# Patient Record
Sex: Male | Born: 1975 | Race: White | Hispanic: No | Smoking: Never smoker
Health system: Southern US, Community
[De-identification: ages and names within clinical notes are randomized; demographics above are authoritative.]

## PROBLEM LIST (undated history)

## (undated) DIAGNOSIS — I10 Essential (primary) hypertension: Secondary | ICD-10-CM

## (undated) HISTORY — PX: TONSILLECTOMY: SUR1361

## (undated) HISTORY — DX: Essential (primary) hypertension: I10

---

## 2019-02-23 ENCOUNTER — Emergency Department (HOSPITAL_COMMUNITY)
Admission: EM | Admit: 2019-02-23 | Discharge: 2019-02-23 | Disposition: A | Discharge status: Home / Self Care | Payer: BC Managed Care – PPO | Attending: Emergency Medicine | Admitting: Emergency Medicine

## 2019-02-23 ENCOUNTER — Emergency Department (HOSPITAL_COMMUNITY): Payer: BC Managed Care – PPO

## 2019-02-23 DIAGNOSIS — I10 Essential (primary) hypertension: Secondary | ICD-10-CM | POA: Insufficient documentation

## 2019-02-23 DIAGNOSIS — R519 Headache, unspecified: Secondary | ICD-10-CM

## 2019-02-23 DIAGNOSIS — R51 Headache: Secondary | ICD-10-CM | POA: Diagnosis not present

## 2019-02-23 DIAGNOSIS — R202 Paresthesia of skin: Secondary | ICD-10-CM | POA: Insufficient documentation

## 2019-02-23 NOTE — Discharge Instructions (Signed)
Please return for any problem.  Follow-up with your regular care provider as instructed. °

## 2019-02-23 NOTE — ED Notes (Signed)
Patient transported to CT 

## 2019-02-23 NOTE — ED Provider Notes (Signed)
[facility] Provider Note   CSN: [id number] Arrival date & time: 02/23/19  1306     History   Chief Complaint Chief Complaint  Patient presents with  . Hypertension    HPI Nicki ReaperDenis Inglis is a 43 y.o. male.     43 year old male with prior medical history as detailed below presents for evaluation of reported headache.  Patient reports onset of headache yesterday morning.  This headache persisted throughout the entire day.  He currently is now headache free.  He did experience some mild tingling of the left cheek during his headache.  The tingling occurred earlier this morning.  He denies other associated symptoms such as fever, nausea, vomiting, other focal weakness or paresthesia.  Patient denies prior history of migraines.  Patient reports that he has some situational hypertension related to anxiety.  He is not currently taking antihypertensives.  The history is provided by the patient and medical records.  Headache Pain location:  Occipital Quality:  Dull Radiates to:  Does not radiate Onset quality:  Gradual Duration:  1 day Timing:  Constant Progression:  Resolved Chronicity:  New Similar to prior headaches: no   Relieved by:  Nothing Worsened by:  Nothing   No past medical history on file.  There are no active problems to display for this patient.        Home Medications    Prior to Admission medications   Not on File    Family History No family history on file.  Social History Social History   Tobacco Use  . Smoking status: Not on file  Substance Use Topics  . Alcohol use: Not on file  . Drug use: Not on file     Allergies   Patient has no known allergies.   Review of Systems Review of Systems  Neurological: Positive for headaches.  All other systems reviewed and are negative.    Physical Exam Updated Vital Signs BP (!) 144/96 (BP Location: Right Arm)   Pulse 74   Temp 98.7 F (37.1 C) (Oral)    Resp 16   SpO2 100%   Physical Exam Vitals signs and nursing note reviewed.  Constitutional:      General: He is not in acute distress.    Appearance: He is well-developed.  HENT:     Head: Normocephalic and atraumatic.  Eyes:     Conjunctiva/sclera: Conjunctivae normal.     Pupils: Pupils are equal, round, and reactive to light.  Neck:     Musculoskeletal: Normal range of motion and neck supple.  Cardiovascular:     Rate and Rhythm: Normal rate and regular rhythm.     Heart sounds: Normal heart sounds.  Pulmonary:     Effort: Pulmonary effort is normal. No respiratory distress.     Breath sounds: Normal breath sounds.  Abdominal:     General: There is no distension.     Palpations: Abdomen is soft.     Tenderness: There is no abdominal tenderness.  Musculoskeletal: Normal range of motion.        General: No deformity.  Skin:    General: Skin is warm and dry.  Neurological:     General: No focal deficit present.     Mental Status: He is alert and oriented to person, place, and time. Mental status is at baseline.     Cranial Nerves: No cranial nerve deficit.     Sensory: No sensory deficit.     Motor: No weakness.  Coordination: Coordination normal.     Gait: Gait normal.      ED Treatments / Results  Labs (all labs ordered are listed, but only abnormal results are displayed) Labs Reviewed - No data to display  EKG EKG Interpretation  Date/Time:  Friday February 23 2019 13:14:08 EDT Ventricular Rate:  74 PR Interval:  130 QRS Duration: 104 QT Interval:  408 QTC Calculation: 452 R Axis:   68 Text Interpretation:  Sinus rhythm with sinus arrhythmia with occasional Premature ventricular complexes Incomplete right bundle branch block Nonspecific ST abnormality Abnormal ECG Confirmed by Dene Gentry [phone]) on 02/23/2019 5:22:31 PM   Radiology Ct Head Wo Contrast  Result Date: 02/23/2019 CLINICAL DATA:  Severe headache EXAM: CT HEAD WITHOUT CONTRAST  TECHNIQUE: Contiguous axial images were obtained from the base of the skull through the vertex without intravenous contrast. COMPARISON:  None. FINDINGS: Brain: No evidence of acute infarction, hemorrhage, hydrocephalus, extra-axial collection or mass lesion/mass effect. Vascular: No hyperdense vessel or unexpected calcification. Skull: Normal. Negative for fracture or focal lesion. Sinuses/Orbits: Mucous retention cysts and mucosal thickening left maxillary sinus. Other: None IMPRESSION: Negative non contrasted CT appearance of the brain Electronically Signed   By: Donavan Foil M.D.   On: 02/23/2019 19:30    Procedures Procedures (including critical care time)  Medications Ordered in ED Medications - No data to display   Initial Impression / Assessment and Plan / ED Course  I have reviewed the triage vital signs and the nursing notes.  Pertinent labs & imaging results that were available during my care of the patient were reviewed by me and considered in my medical decision making (see chart for details).        MDM  Screen complete  Odell Choung was evaluated in Emergency Department on 02/23/2019 for the symptoms described in the history of present illness. He was evaluated in the context of the global COVID-19 pandemic, which necessitated consideration that the patient might be at risk for infection with the SARS-CoV-2 virus that causes COVID-19. Institutional protocols and algorithms that pertain to the evaluation of patients at risk for COVID-19 are in a state of rapid change based on information released by regulatory bodies including the CDC and federal and state organizations. These policies and algorithms were followed during the patient's care in the ED.   Patient is presenting for evaluation of reported headache.  His symptoms have resolved completely prior to arrival.  His neuro exam is normal.  He does not have any findings consistent with or concerning for significant acute  pathology.  Patient's blood pressure is noted to be mildly elevated.  He understands the need for close follow-up regarding this.  CT imaging of his brain does not show anything acute.  Patient does feel improved following his ED evaluation.  He now desires discharge home.  Importance of close follow-up was stressed.   Final Clinical Impressions(s) / ED Diagnoses   Final diagnoses:  Hypertension, unspecified type  Acute nonintractable headache, unspecified headache type    ED Discharge Orders    None       Valarie Merino, MD 02/23/19 [phone]

## 2019-02-23 NOTE — ED Triage Notes (Signed)
States has experienced headaches for the past 2 days but denies at present time. Denies blurred vision, weakness, numbness, or tingling in extremities.

## 2019-02-23 NOTE — ED Notes (Signed)
Patient verbalizes understanding of discharge instructions. Opportunity for questioning and answers were provided. Armband removed by staff, pt discharged from ED ambulatory to home.  

## 2019-02-23 NOTE — ED Triage Notes (Signed)
Pt to ER sent from [facility] for evaluation of hypertension. No previous diagnosis of same, not on any medications. Reports during his divorce a few years ago he had this problem and lost some weight and his BP decreased. He is in NAD. Apparently this morning he had some facial numbness which has since resolved. No focal neuro deficits apparent.

## 2019-05-17 ENCOUNTER — Other Ambulatory Visit: Payer: Self-pay

## 2019-05-17 DIAGNOSIS — Z20822 Contact with and (suspected) exposure to covid-19: Secondary | ICD-10-CM

## 2019-05-19 LAB — NOVEL CORONAVIRUS, NAA: SARS-CoV-2, NAA: DETECTED — AB

## 2019-07-22 ENCOUNTER — Ambulatory Visit: Payer: BC Managed Care – PPO

## 2020-09-07 IMAGING — CT CT HEAD W/O CM
4 series · 17 of 47 positions shown, 19 images · non-contrast
Comparison: None.

CLINICAL DATA: Severe headache

EXAM:
CT HEAD WITHOUT CONTRAST
TECHNIQUE: Contiguous axial images were obtained from the base of the skull
through the vertex without intravenous contrast.

[Series 3: head without · axial · non-contrast · 0.46mm/px · z∈[-66,+64]mm · 7 of 36 slices shown, 9 images]
[im 5/36  brain]
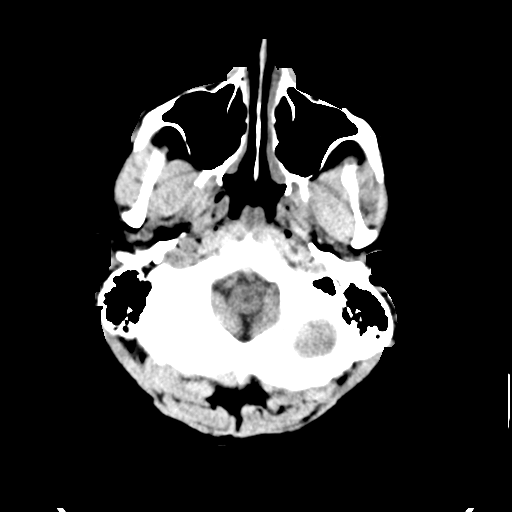
[im 5/36  bone]
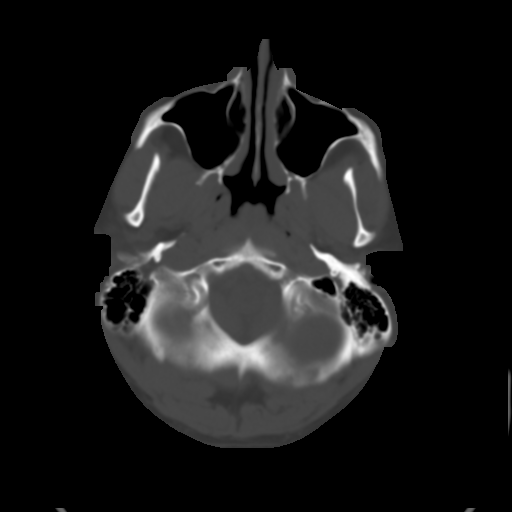
[im 9/36  brain]
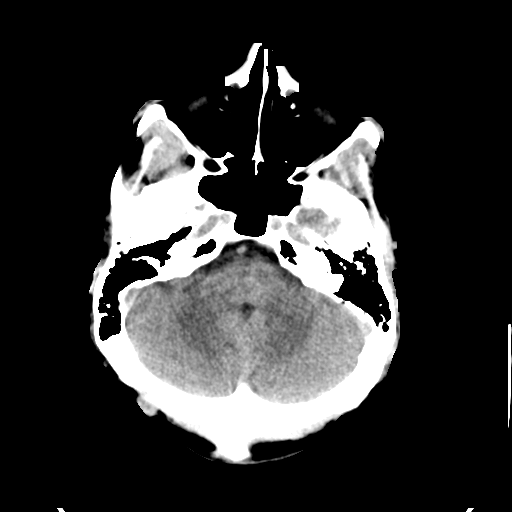
[im 14/36  brain]
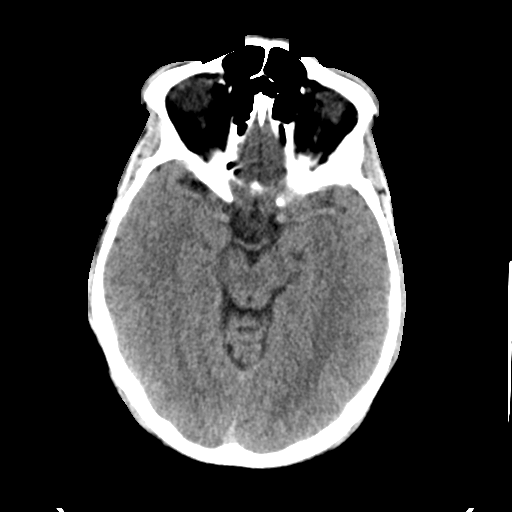
[im 18/36  brain]
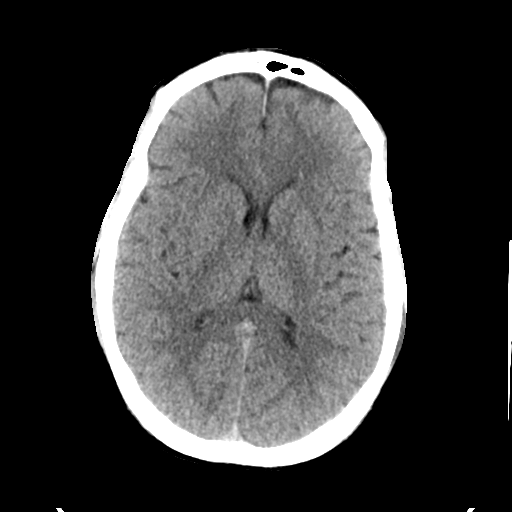
[im 22/36  brain]
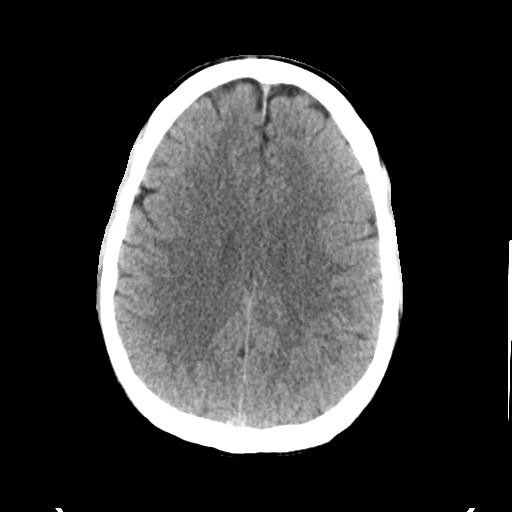
[im 22/36  bone]
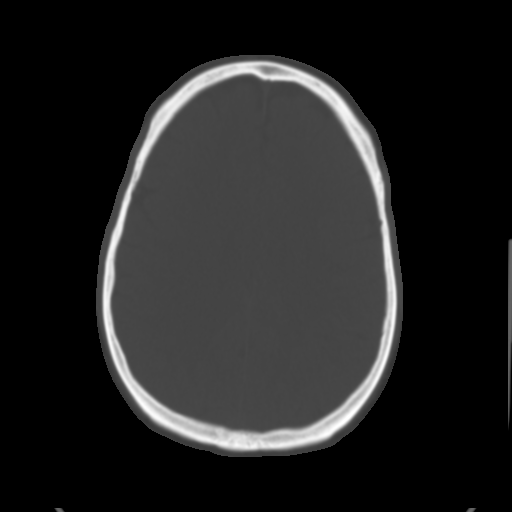
[im 27/36  brain]
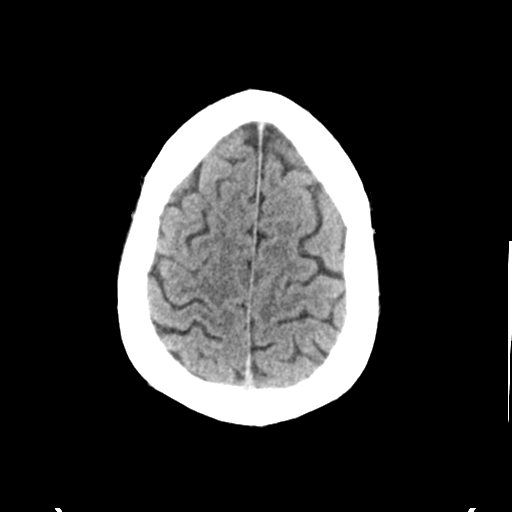
[im 31/36  brain]
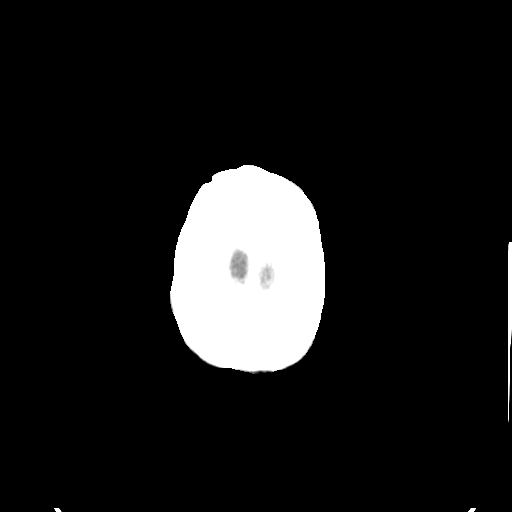

[Series 4: head bone · axial · 0.46mm/px · z∈[-70,-6]mm · 4 of 90 slices shown]
[im 9/90  bone]
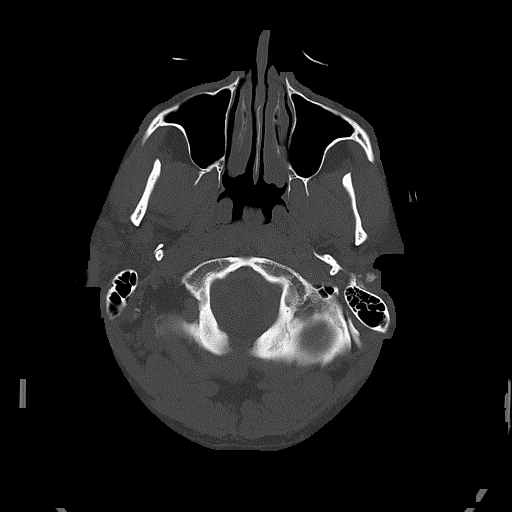
[im 18/90  bone]
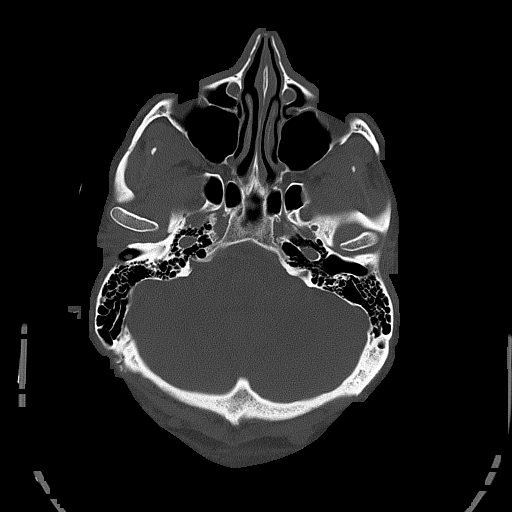
[im 27/90  bone]
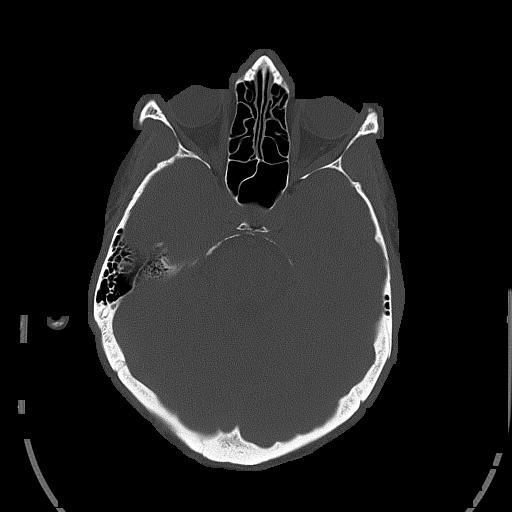
[im 41/90  bone]
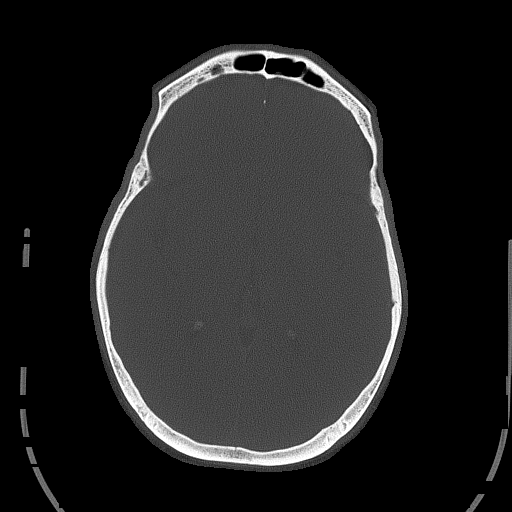

[Series 5: head without cor · coronal · non-contrast · 0.35mm/px · 3 of 76 slices shown]
[im 26/76  brain]
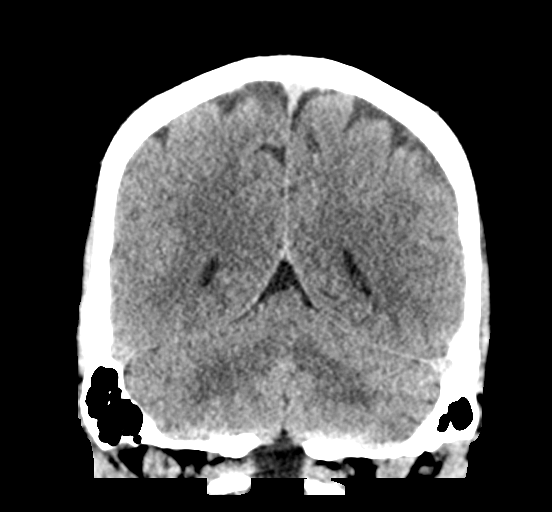
[im 34/76  brain]
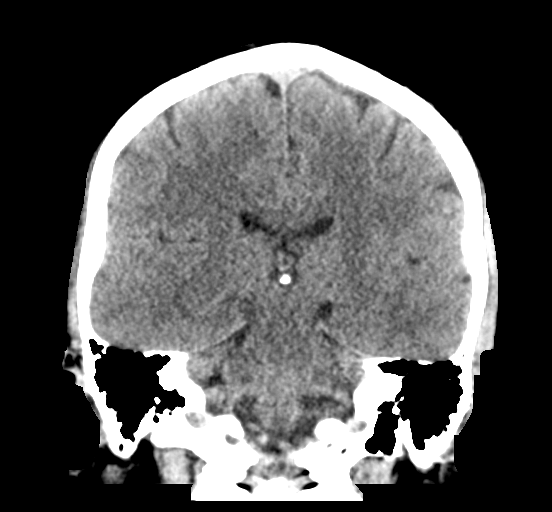
[im 42/76  brain]
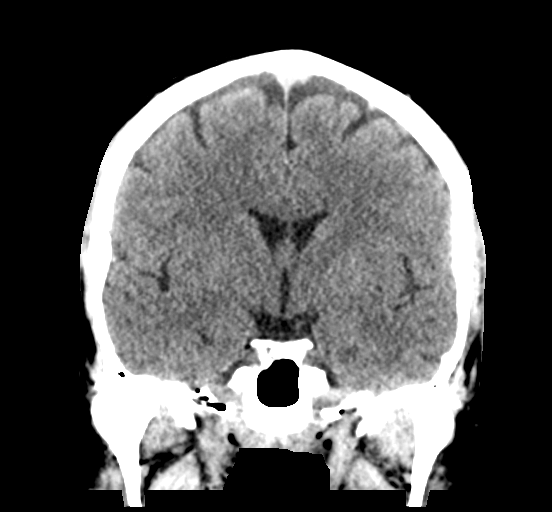

[Series 6: head without sag · sagittal · non-contrast · 0.35mm/px · 3 of 65 slices shown]
[im 22/65  brain]
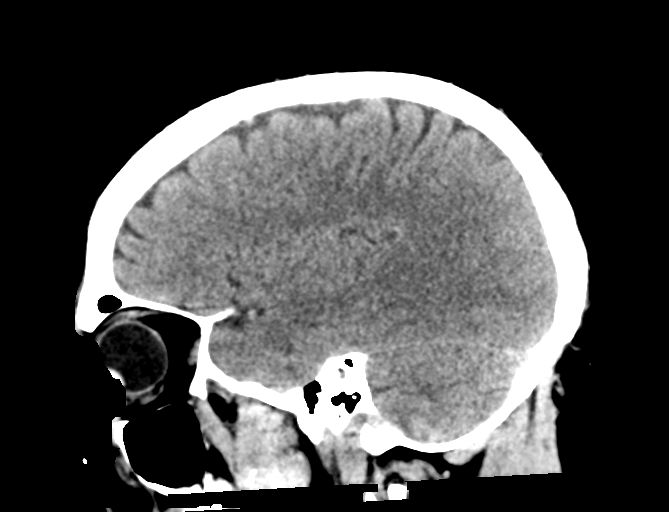
[im 33/65  brain]
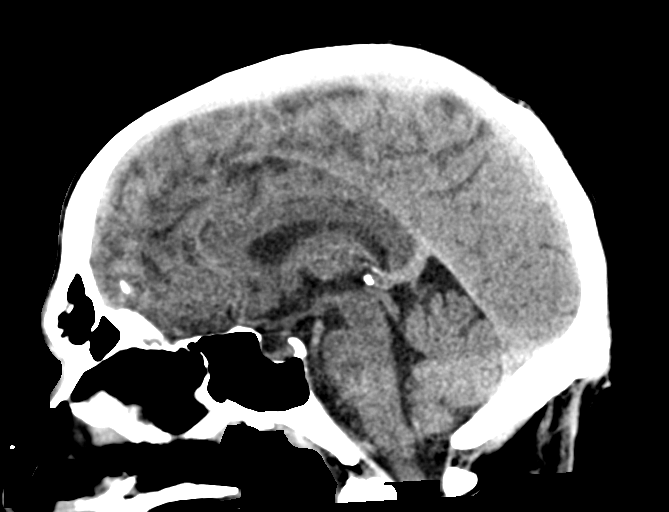
[im 43/65  brain]
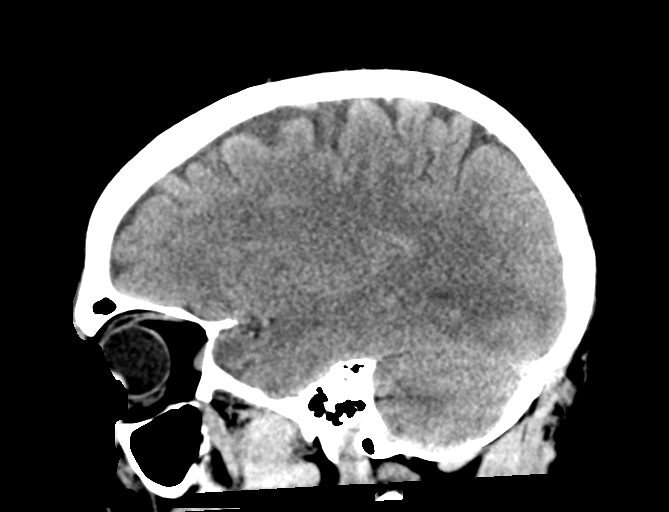

[17 of 47 positions shown; findings below may reference images not displayed]

FINDINGS: Brain: No evidence of acute infarction, hemorrhage, hydrocephalus,
extra-axial collection or mass lesion/mass effect.

Vascular: No hyperdense vessel or unexpected calcification.

Skull: Normal. Negative for fracture or focal lesion.

Sinuses/Orbits: Mucous retention cysts and mucosal thickening left
maxillary sinus.

Other: None
IMPRESSION: Negative non contrasted CT appearance of the brain

## 2023-07-26 ENCOUNTER — Other Ambulatory Visit: Payer: Self-pay

## 2023-07-26 ENCOUNTER — Emergency Department (HOSPITAL_BASED_OUTPATIENT_CLINIC_OR_DEPARTMENT_OTHER): Payer: 59

## 2023-07-26 ENCOUNTER — Encounter (HOSPITAL_BASED_OUTPATIENT_CLINIC_OR_DEPARTMENT_OTHER): Payer: Self-pay | Admitting: *Deleted

## 2023-07-26 ENCOUNTER — Emergency Department (HOSPITAL_BASED_OUTPATIENT_CLINIC_OR_DEPARTMENT_OTHER)
Admission: EM | Admit: 2023-07-26 | Discharge: 2023-07-26 | Disposition: A | Discharge status: Home / Self Care | Payer: 59 | Attending: Emergency Medicine | Admitting: Emergency Medicine

## 2023-07-26 DIAGNOSIS — I10 Essential (primary) hypertension: Secondary | ICD-10-CM | POA: Diagnosis not present

## 2023-07-26 DIAGNOSIS — Z79899 Other long term (current) drug therapy: Secondary | ICD-10-CM | POA: Insufficient documentation

## 2023-07-26 DIAGNOSIS — R051 Acute cough: Secondary | ICD-10-CM | POA: Insufficient documentation

## 2023-07-26 DIAGNOSIS — R000 Tachycardia, unspecified: Secondary | ICD-10-CM | POA: Diagnosis not present

## 2023-07-26 DIAGNOSIS — R778 Other specified abnormalities of plasma proteins: Secondary | ICD-10-CM | POA: Insufficient documentation

## 2023-07-26 DIAGNOSIS — R072 Precordial pain: Secondary | ICD-10-CM | POA: Diagnosis present

## 2023-07-26 DIAGNOSIS — R7989 Other specified abnormal findings of blood chemistry: Secondary | ICD-10-CM

## 2023-07-26 LAB — HEPATIC FUNCTION PANEL
ALT: 38 U/L (ref 0–44)
AST: 21 U/L (ref 15–41)
Albumin: 4.1 g/dL (ref 3.5–5.0)
Alkaline Phosphatase: 39 U/L (ref 38–126)
Bilirubin, Direct: 0.1 mg/dL (ref 0.0–0.2)
Indirect Bilirubin: 0.4 mg/dL (ref 0.3–0.9)
Total Bilirubin: 0.5 mg/dL (ref 0.0–1.2)
Total Protein: 7 g/dL (ref 6.5–8.1)

## 2023-07-26 LAB — BASIC METABOLIC PANEL
Anion gap: 10 (ref 5–15)
BUN: 16 mg/dL (ref 6–20)
CO2: 21 mmol/L — ABNORMAL LOW (ref 22–32)
Calcium: 8.7 mg/dL — ABNORMAL LOW (ref 8.9–10.3)
Chloride: 105 mmol/L (ref 98–111)
Creatinine, Ser: 0.95 mg/dL (ref 0.61–1.24)
GFR, Estimated: >60 mL/min (ref >60)
Glucose, Bld: 114 mg/dL — ABNORMAL HIGH (ref 70–99)
Potassium: 3.4 mmol/L — ABNORMAL LOW (ref 3.5–5.1)
Sodium: 136 mmol/L (ref 135–145)

## 2023-07-26 LAB — RESP PANEL BY RT-PCR (RSV, FLU A&B, COVID)  RVPGX2
Influenza A by PCR: NEGATIVE
Influenza B by PCR: NEGATIVE
Resp Syncytial Virus by PCR: NEGATIVE
SARS Coronavirus 2 by RT PCR: NEGATIVE

## 2023-07-26 LAB — CBC
HCT: 41 % (ref 39.0–52.0)
Hemoglobin: 14 g/dL (ref 13.0–17.0)
MCH: 31 pg (ref 26.0–34.0)
MCHC: 34.1 g/dL (ref 30.0–36.0)
MCV: 90.9 fL (ref 80.0–100.0)
Platelets: 229 10*3/uL (ref 150–400)
RBC: 4.51 MIL/uL (ref 4.22–5.81)
RDW: 12.5 % (ref 11.5–15.5)
WBC: 6.3 10*3/uL (ref 4.0–10.5)
nRBC: 0 % (ref 0.0–0.2)

## 2023-07-26 LAB — TSH: TSH: 0.84 u[IU]/mL (ref 0.350–4.500)

## 2023-07-26 LAB — D-DIMER, QUANTITATIVE: D-Dimer, Quant: 0.28 ug{FEU}/mL (ref 0.00–0.50)

## 2023-07-26 LAB — BRAIN NATRIURETIC PEPTIDE: B Natriuretic Peptide: 46.7 pg/mL (ref 0.0–100.0)

## 2023-07-26 LAB — TROPONIN I (HIGH SENSITIVITY)
Troponin I (High Sensitivity): 24 ng/L — ABNORMAL HIGH (ref <18)
Troponin I (High Sensitivity): 26 ng/L — ABNORMAL HIGH (ref <18)

## 2023-07-26 MED ORDER — AMLODIPINE BESYLATE 5 MG PO TABS: 5.0000 mg | ORAL_TABLET | Freq: Every day | ORAL | 0 refills | Status: DC

## 2023-07-26 NOTE — ED Notes (Signed)
 Pt ambulatory to bathroom, no assistance needed.

## 2023-07-26 NOTE — ED Triage Notes (Signed)
Pt reports that his BP is elevated and he has a feeling like his heart is skipping beats and he has some pressure on his chest.  Pt reports that these symptoms have been going on for 2 weeks.  He states that he began having a cough about 2 weeks ago and he states that he has had a lot stress.

## 2023-07-26 NOTE — ED Notes (Signed)
 D/c paperwork reviewed with pt, including prescriptions and follow up care.  All questions and/or concerns addressed at time of d/c.  No further needs expressed. . Pt verbalized understanding, Ambulatory with significant other to ED exit, NAD.

## 2023-07-26 NOTE — Discharge Instructions (Signed)
Please read and follow all provided instructions.  Your diagnoses today include:  1. Precordial pain   2. Acute cough   3. Elevated troponin   4. Uncontrolled hypertension     Tests performed today include: An EKG of your heart: was abnormal but not changed during ED stay A chest x-ray: was clear Cardiac enzymes - a blood test for heart muscle damage, mildly elevated today but not rising significantly Blood counts and electrolytes D-dimer: Screening test for blood clot was negative BNP: Screening test for heart failure was negative Vital signs. See below for your results today.   Medications prescribed:  Amlodipine: Medication for blood pressure  Take any prescribed medications only as directed.  Follow-up instructions: Please follow-up with your primary care provider as soon as you can for further evaluation of your symptoms.  I have also placed a cardiology referral on your behalf.  Please follow-up with them in the next week for recheck of your symptoms and to determine if you need further evaluation.  Return instructions:  SEEK IMMEDIATE MEDICAL ATTENTION IF: You have severe chest pain, especially if the pain is crushing or pressure-like and spreads to the arms, back, neck, or jaw, or if you have sweating, nausea or vomiting, or trouble with breathing. THIS IS AN EMERGENCY. Do not wait to see if the pain will go away. Get medical help at once. Call 911. DO NOT drive yourself to the hospital.  Your chest pain gets worse and does not go away after a few minutes of rest.  You have an attack of chest pain lasting longer than what you usually experience.  You have significant dizziness, if you pass out, or have trouble walking.  You have chest pain not typical of your usual pain for which you originally saw your caregiver.  You have any other emergent concerns regarding your health.  Additional Information: Chest pain comes from many different causes. Your caregiver has diagnosed  you as having chest pain that is not specific for one problem, but does not require admission.  You are at low risk for an acute heart condition or other serious illness.   Your vital signs today were: BP (!) 161/105   Pulse 90   Temp 99.7 F (37.6 C)   Resp (!) 22   Ht 5\' 9"  (1.753 m)   Wt 104.3 kg   SpO2 94%   BMI 33.97 kg/m  If your blood pressure (BP) was elevated above 135/85 this visit, please have this repeated by your doctor within one month. --------------

## 2023-07-26 NOTE — ED Provider Notes (Signed)
[place] [facility] Provider Note   CSN: [id number] Arrival date & time: 07/26/23  1445     History  Chief Complaint  Patient presents with   Chest Pain   Hypertension    Cody Hayes is a 48 y.o. male.  Patient with no significant past medical history presents to the emergency department for evaluation of cough, shortness of breath, tachycardia, elevated temperature.  Patient reports having cough and some intermittent chest pains starting about 2 weeks ago.  He has not had other infectious symptoms such as runny nose, sore throat.  Cough is mostly dry.  He will get some chest burning in the mid chest/pressure at times with this.  He reports decreased exercise tolerance.  He has felt like his heart has been racing but denies any history of thyroid issues or recent weight loss.  He could feel some skipped beats earlier today.  He also checked his blood pressure today and noted to be markedly elevated, prompting ED visit.  No lightheadedness or syncope.  No history of high cholesterol, diabetes or heart disease (family history of diabetes).  Does not use tobacco.  No lower extremity swelling.  Does report some increased dressers.       Home Medications Prior to Admission medications   Not on File      Allergies    Patient has no known allergies.    Review of Systems   Review of Systems  Physical Exam Updated Vital Signs BP (!) 171/114   Pulse (!) 111   Temp 99.7 F (37.6 C)   Resp 16   SpO2 97%   Physical Exam Vitals and nursing note reviewed.  Constitutional:      Appearance: He is well-developed. He is not diaphoretic.  HENT:     Head: Normocephalic and atraumatic.     Mouth/Throat:     Mouth: Mucous membranes are not dry.  Eyes:     Conjunctiva/sclera: Conjunctivae normal.  Neck:     Vascular: Normal carotid pulses. No carotid bruit or JVD.     Trachea: Trachea normal. No tracheal deviation.  Cardiovascular:     Rate and  Rhythm: Regular rhythm. Tachycardia present.     Pulses: No decreased pulses.          Radial pulses are 2+ on the right side and 2+ on the left side.     Heart sounds: Normal heart sounds, S1 normal and S2 normal. Heart sounds not distant. No murmur heard.    Comments: Mild tachycardia Pulmonary:     Effort: Pulmonary effort is normal. No respiratory distress.     Breath sounds: Normal breath sounds. No wheezing, rhonchi or rales.     Comments: Occasional dry cough during exam Chest:     Chest wall: No tenderness.  Abdominal:     General: Bowel sounds are normal.     Palpations: Abdomen is soft.     Tenderness: There is no abdominal tenderness. There is no guarding or rebound.  Musculoskeletal:     Cervical back: Normal range of motion and neck supple. No muscular tenderness.     Right lower leg: No tenderness. No edema.     Left lower leg: No tenderness. No edema.  Skin:    General: Skin is warm and dry.     Coloration: Skin is not pale.  Neurological:     Mental Status: He is alert. Mental status is at baseline.  Psychiatric:  Mood and Affect: Mood normal.     ED Results / Procedures / Treatments   Labs (all labs ordered are listed, but only abnormal results are displayed) Labs Reviewed  BASIC METABOLIC PANEL - Abnormal; Notable for the following components:      Result Value   Potassium 3.4 (*)    CO2 21 (*)    Glucose, Bld 114 (*)    Calcium 8.7 (*)    All other components within normal limits  TROPONIN I (HIGH SENSITIVITY) - Abnormal; Notable for the following components:   Troponin I (High Sensitivity) 24 (*)    All other components within normal limits  TROPONIN I (HIGH SENSITIVITY) - Abnormal; Notable for the following components:   Troponin I (High Sensitivity) 26 (*)    All other components within normal limits  RESP PANEL BY RT-PCR (RSV, FLU A&B, COVID)  RVPGX2  CBC  BRAIN NATRIURETIC PEPTIDE  D-DIMER, QUANTITATIVE  HEPATIC FUNCTION PANEL  TSH     EKG EKG Interpretation Date/Time:  Tuesday July 26 2023 15:00:26 EST Ventricular Rate:  110 PR Interval:  142 QRS Duration:  103 QT Interval:  340 QTC Calculation: 460 R Axis:   62  Text Interpretation: Sinus tachycardia RSR' in V1 or V2, right VCD or RVH Repol abnrm, severe global ischemia (LM/MVD) Since last tracing rate faster Otherwise no significant change Confirmed by Melene Plan [phone]) on 07/26/2023 3:14:40 PM  Radiology DG Chest 2 View Result Date: 07/26/2023 CLINICAL DATA:  Chest pressure and hypertension EXAM: CHEST - 2 VIEW COMPARISON:  None Available. FINDINGS: The heart size and mediastinal contours are within normal limits. Both lungs are clear. The visualized skeletal structures are unremarkable. IMPRESSION: No active cardiopulmonary disease. Electronically Signed   By: Alcide Clever M.D.   On: 07/26/2023 17:08    Procedures Procedures    Medications Ordered in ED Medications - No data to display  ED Course/ Medical Decision Making/ A&P    Patient seen and examined. History obtained directly from patient and wife at bedside.  Labs/EKG: Labs ordered in triage personally reviewed and interpreted including CBC which was unremarkable; BMP which shows minimally low potassium at 3.4, bicarb 21, glucose 114; troponin normal at 24.  Awaiting second troponin.  Added hepatic function panel, TSH, BNP, D-dimer.  Personally reviewed and interpreted EKG as above.  Reviewed with Dr. Adela Lank.  Imaging: Chest x-ray performed and is pending.  Medications/Fluids: None ordered  Most recent vital signs reviewed and are as follows: BP (!) 171/114   Pulse (!) 111   Temp 99.7 F (37.6 C)   Resp 16   SpO2 97%   Initial impression: Cough/shortness of breath, tachycardia, mildly elevated troponin with abnormal EKG.  No STEMI.  Workup in progress.  5:01 PM D-dimer was normal; BNP normal, hepatic function panel.  Patient discussed with and seen by Dr. Adela Lank.  6:04 PM  Reassessment performed. Patient appears stable.  Labs personally reviewed and interpreted including: Second troponin 24 >> 26.  Repeat EKG appears stable and unchanged, slower rate.  Viral panel negative.  ED ECG REPORT   Date: 07/26/2023  Rate: 96  Rhythm: normal sinus rhythm  QRS Axis: normal  Intervals: normal  ST/T Wave abnormalities: nonspecific T wave changes  Conduction Disutrbances:none  Narrative Interpretation:   Old EKG Reviewed: unchanged, except slower  I have personally reviewed the EKG tracing and agree with the computerized printout as noted.   Imaging personally visualized and interpreted including: Chest x-ray agree negative  Reviewed  pertinent lab work and imaging with patient at bedside. Questions answered.   Most current vital signs reviewed and are as follows: BP (!) 147/103   Pulse 95   Temp 99.7 F (37.6 C)   Resp 15   Ht 5\' 9"  (1.753 m)   Wt 104.3 kg   SpO2 94%   BMI 33.97 kg/m   Plan: Discharge to home.   Prescriptions written for: Amlodipine  Return and follow-up instructions: I encouraged patient to return to ED with severe chest pain, especially if the pain is crushing or pressure-like and spreads to the arms, back, neck, or jaw, or if they have associated sweating, vomiting, or shortness of breath with the pain, or significant pain with activity. We discussed that the evaluation here today indicates a low-risk of serious cause of chest pain, including heart trouble or a blood clot, but no evaluation is perfect and chest pain can evolve with time. The patient verbalized understanding and agreed.  I encouraged patient to follow-up with their provider in the next 48 hours for recheck.                                   Medical Decision Making Amount and/or Complexity of Data Reviewed Labs: ordered. Radiology: ordered.   For this patient's complaint of chest pain, the following emergent conditions were considered on the differential  diagnosis: acute coronary syndrome, pulmonary embolism, pneumothorax, myocarditis, pericardial tamponade, aortic dissection, thoracic aortic aneurysm complication, esophageal perforation.   Other causes were also considered including: gastroesophageal reflux disease, musculoskeletal pain including costochondritis, pneumonia/pleurisy, herpes zoster, pericarditis.  Evaluation shows mildly elevated but stable troponins, normal BNP, normal D-dimer.  Patient has some T wave abnormality on EKG but this is stable during ED stay.  No hypoxia.  Overall low concern for PE or acute coronary syndrome.  Tachycardia improved into the 90s.  Blood pressures improved during ED stay, but remain elevated.  Patient be started on amlodipine for blood pressure control.  Patient was started on blood pressure medication and referred to cardiology.  He may need further outpatient workup, however do not feel that patient needs to be admitted today for this.  We did discuss signs and symptoms to return and he seems reliable to do so.  The patient's vital signs, pertinent lab work and imaging were reviewed and interpreted as discussed in the ED course. Hospitalization was considered for further testing, treatments, or serial exams/observation. However as patient is well-appearing, has a stable exam, and reassuring studies today, I do not feel that they warrant admission at this time. This plan was discussed with the patient who verbalizes agreement and comfort with this plan and seems reliable and able to return to the Emergency Department with worsening or changing symptoms.             Final Clinical Impression(s) / ED Diagnoses Final diagnoses:  Precordial pain  Acute cough  Elevated troponin  Uncontrolled hypertension    Rx / DC Orders ED Discharge Orders          Ordered    amLODipine (NORVASC) 5 MG tablet  Daily        07/26/23 1759    Ambulatory referral to Cardiology       Comments: If you have not  heard from the Cardiology office within the next 72 hours please call [phone].   07/26/23 1801  Renne Crigler, PA-C 07/26/23 1809    Melene Plan, DO 07/26/23 [phone]

## 2023-07-28 ENCOUNTER — Ambulatory Visit: Payer: 59

## 2023-07-28 VITALS — BP 180/114 | HR 109 | Ht 69.0 in | Wt 231.0 lb

## 2023-07-28 DIAGNOSIS — R079 Chest pain, unspecified: Secondary | ICD-10-CM

## 2023-07-28 DIAGNOSIS — I10 Essential (primary) hypertension: Secondary | ICD-10-CM | POA: Diagnosis not present

## 2023-07-28 HISTORY — DX: Chest pain, unspecified: R07.9

## 2023-07-28 MED ORDER — CARVEDILOL 6.25 MG PO TABS: 6.2500 mg | ORAL_TABLET | Freq: Two times a day (BID) | ORAL | 3 refills | Status: DC

## 2023-07-28 MED ORDER — METOPROLOL TARTRATE 100 MG PO TABS: 100.0000 mg | ORAL_TABLET | Freq: Once | ORAL | 0 refills | Status: DC

## 2023-07-28 NOTE — Assessment & Plan Note (Addendum)
Associated with elevated blood pressures, recent upper respiratory symptoms. Mild flat high-sensitivity troponin elevation at recent ER visit 24th, 26. EKG with ST depression at mild sinus tachycardia in 110s and even present on repeat EKG with heart rate 96/min.  Nonspecific EKG changes could be a sign of demand ischemia in the setting of elevated blood pressures and heart rates.  He feels his symptoms are slightly better compared to 2 days ago. He has been taking amlodipine for the last 2 days but no significant improvement in blood pressure readings yet, anticipate amlodipine effect to take at least 48 to 72 hours.  -Will assess cardiac structure and function with an expedited echocardiogram to rule out any significant issues.  - Given ST depression with mild heart rate elevation, would recommend further evaluation for coronary artery disease with CT coronary angiogram. Other noninvasive methods with stress test discussed but given the ST segment changes would not be an ideal option. He is agreeable. With orders CT coronary angiogram. Additional dose of metoprolol tartrate 100 mg on the morning of the test to optimize heart rates.  Further optimization of blood pressure as reviewed below under hypertension.  Advised to avoid moderate to heavy exertion at this time given his uncontrolled blood pressure and atypical chest discomfort.

## 2023-07-28 NOTE — Progress Notes (Signed)
Cardiology Consultation:    Date:  07/28/2023   ID:  Cody Hayes, DOB 04-14-1976, MRN [id number]  PCP:  Tally Joe, MD  Cardiologist:  Marlyn Corporal Maddux First, MD   Referring MD: Renne Crigler, PA-C   No chief complaint on file.    ASSESSMENT AND PLAN:   Mr. Cody Hayes 48 year old male with symptoms of upper respiratory infection for the last 2 to 3 weeks, with significant symptoms of shortness of breath, elevated blood pressures.  Problem List Items Addressed This Visit     Chest pain of uncertain etiology - Primary   Associated with elevated blood pressures, recent upper respiratory symptoms. Mild flat high-sensitivity troponin elevation at recent ER visit 24th, 26. EKG with ST depression at mild sinus tachycardia in 110s and even present on repeat EKG with heart rate 96/min.  Nonspecific EKG changes could be a sign of demand ischemia in the setting of elevated blood pressures and heart rates.  He feels his symptoms are slightly better compared to 2 days ago. He has been taking amlodipine for the last 2 days but no significant improvement in blood pressure readings yet, anticipate amlodipine effect to take at least 48 to 72 hours.  -Will assess cardiac structure and function with an expedited echocardiogram to rule out any significant issues.  - Given ST depression with mild heart rate elevation, would recommend further evaluation for coronary artery disease with CT coronary angiogram. Other noninvasive methods with stress test discussed but given the ST segment changes would not be an ideal option. He is agreeable. With orders CT coronary angiogram. Additional dose of metoprolol tartrate 100 mg on the morning of the test to optimize heart rates.  Further optimization of blood pressure as reviewed below under hypertension.  Advised to avoid moderate to heavy exertion at this time given his uncontrolled blood pressure and atypical chest discomfort.      Relevant Medications    metoprolol tartrate (LOPRESSOR) 100 MG tablet   Other Relevant Orders   ECHOCARDIOGRAM COMPLETE   CT CORONARY MORPH W/CTA COR W/SCORE W/CA W/CM &/OR WO/CM   Basic Metabolic Panel (BMET)   Hypertension   Uncontrolled markedly elevated blood pressures. Continue amlodipine 5 mg once daily. Start carvedilol 6.25 mg twice daily.  Discussed medication action and side effects. He is agreeable to proceed.  Advised further evaluation with his PCP for evaluation of sleep apnea which is treated for hypertension.       Relevant Medications   carvedilol (COREG) 6.25 MG tablet   metoprolol tartrate (LOPRESSOR) 100 MG tablet   Other Relevant Orders   ECHOCARDIOGRAM COMPLETE   Return to clinic tentatively in 1 month.   History of Present Illness:    Cody Hayes is a 48 y.o. male who is being seen today for the evaluation of chest pain and uncontrolled blood pressure at the request of Renne Crigler, PA-C.   Pleasant man here for the visit by himself and lives with his wife and children at home.  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  No significant prior cardiac health history.  Not taking any significant medications at home  Recently 2025 for shortness of breath and tachycardia with intermittent chest pains in the setting of fever and cough.  He was hypertensive with blood pressure 171/114, sinus tachycardia 111, low-grade fever 99.7 Fahrenheit.  High-sensitivity troponin I was jagged which was 24, 26 with mild hypokalemia 3.4, BNP and D-dimer was normal, EKG in the ER visit from 07-26-2023 reviewed shows sinus rhythm  heart rate 96/min, PR interval normal 133 ms, borderline ST depression in inferolateral leads with nonspecific T wave changes.  Earlier EKGs on the same day showed sinus tachycardia with heart rates 110s with diffuse ST depression. After the workup he was felt to be overall low risk for primary cardiac issue and referred for follow-up with cardiologist as outpatient.   He was started on amlodipine 5 mg once daily.  Here for the visit today by himself mentions has been taking amlodipine 5 mg once daily for the last 2 days.  Blood pressures at home have remained elevated 150s to 160s systolic and 90s to 110s diastolic.  Heart rates have also been elevated.  Mentions a sense of feeling tired and short of breath with atypical chest discomfort.  He also has been dealing with significant congestion like symptoms with upper respiratory infection over the last 2 to 3 weeks associated with mild cough.  He has been taking over-the-counter medications for this. Not sure which ones he is taking but advised him to be mindful of the decongestant use and take the ones which avoid pseudoephedrine as this can worsen his blood pressure.  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  Has not been exercising for over a year.  Gained around 20 to 30 pounds.  His wife noted to him that he has been snoring more than usual.  He feels he has not been having good quality sleep.  He has not been tested for sleep apnea in the past.  No smoking, alcohol or drug use. One of the grandparents with strokes in their 19s. He has a brother with history of high blood pressure.  He has 2 half siblings without any significant health issues that he knows of.  Past Medical History:  Diagnosis Date   Hypertension     Past Surgical History:  Procedure Laterality Date   TONSILLECTOMY      Current Medications: Current Meds  Medication Sig   amLODipine (NORVASC) 5 MG tablet Take 1 tablet (5 mg total) by mouth daily.   carvedilol (COREG) 6.25 MG tablet Take 1 tablet (6.25 mg total) by mouth 2 (two) times daily.   metoprolol tartrate (LOPRESSOR) 100 MG tablet Take 1 tablet (100 mg total) by mouth once for 1 dose. Please take this medication on the morning of your CT.     Allergies:   Patient has no known allergies.   Social History    Socioeconomic History   Marital status: Divorced    Spouse name: Not on file   Number of children: Not on file   Years of education: Not on file   Highest education level: Not on file  Occupational History   Not on file  Tobacco Use   Smoking status: Never   Smokeless tobacco: Never  Vaping Use   Vaping status: Never Used  Substance and Sexual Activity   Alcohol use: Yes    Alcohol/week: 1.0 standard drink of alcohol    Types: 1 Glasses of wine per week   Drug use: Never   Sexual activity: Not on file  Other Topics Concern   Not on file  Social History Narrative   Not on file   Social Drivers of Health   Financial Resource Strain: Not on file  Food Insecurity: Not on file  Transportation Needs: Not on file  Physical Activity: Not on file  Stress: Not on file  Social Connections: Not on  file     Family History: The patient's family history includes Diabetes in his mother; Hypertension in his brother, father, and mother; Lymphoma in his mother; Stroke in his father. ROS:   Please see the history of present illness.    All 14 point review of systems negative except as described per history of present illness.  EKGs/Labs/Other Studies Reviewed:    The following studies were reviewed today:   EKG:       Recent Labs: 07/26/2023: ALT 38; B Natriuretic Peptide 46.7; BUN 16; Creatinine, Ser 0.95; Hemoglobin 14.0; Platelets 229; Potassium 3.4; Sodium 136; TSH 0.840  Recent Lipid Panel No results found for: "CHOL", "TRIG", "HDL", "CHOLHDL", "VLDL", "LDLCALC", "LDLDIRECT"  Physical Exam:    VS:  BP (!) 180/114 (BP Location: Right Arm, Patient Position: Sitting, Cuff Size: Normal)   Pulse (!) 109   Ht 5\' 9"  (1.753 m)   Wt 231 lb (104.8 kg)   SpO2 97%   BMI 34.11 kg/m     Wt Readings from Last 3 Encounters:  07/28/23 231 lb (104.8 kg)  07/26/23 230 lb (104.3 kg)     GENERAL:  Well nourished, well developed in no acute distress NECK: No JVD; No carotid  bruits CARDIAC: RRR, S1 and S2 present, no murmurs, no rubs, no gallops CHEST:  Clear to auscultation without rales, wheezing or rhonchi  Extremities: No pitting pedal edema. Pulses bilaterally symmetric with radial 2+ and dorsalis pedis 2+ NEUROLOGIC:  Alert and oriented x 3  Medication Adjustments/Labs and Tests Ordered: Current medicines are reviewed at length with the patient today.  Concerns regarding medicines are outlined above.  Orders Placed This Encounter  Procedures   CT CORONARY MORPH W/CTA COR W/SCORE W/CA W/CM &/OR WO/CM   Basic Metabolic Panel (BMET)   ECHOCARDIOGRAM COMPLETE   Meds ordered this encounter  Medications   carvedilol (COREG) 6.25 MG tablet    Sig: Take 1 tablet (6.25 mg total) by mouth 2 (two) times daily.    Dispense:  180 tablet    Refill:  3   metoprolol tartrate (LOPRESSOR) 100 MG tablet    Sig: Take 1 tablet (100 mg total) by mouth once for 1 dose. Please take this medication on the morning of your CT.    Dispense:  1 tablet    Refill:  0    Signed, Manu Rubey reddy Kaius Daino, MD, MPH, [facility]. 07/28/2023 2:46 PM    [facility]

## 2023-07-28 NOTE — Assessment & Plan Note (Addendum)
Uncontrolled markedly elevated blood pressures. Continue amlodipine 5 mg once daily. Start carvedilol 6.25 mg twice daily.  Discussed medication action and side effects. He is agreeable to proceed.  Advised further evaluation with his PCP for evaluation of sleep apnea which is treated for hypertension.

## 2023-07-28 NOTE — Patient Instructions (Signed)
Medication Instructions:  Your physician has recommended you make the following change in your medication:   START: Coreg 6.25 mg two times daily  *If you need a refill on your cardiac medications before your next appointment, please call your pharmacy*   Lab Work: Your physician recommends that you return for lab work in:   Labs today: BMP  If you have labs (blood work) drawn today and your tests are completely normal, you will receive your results only by: MyChart Message (if you have MyChart) OR A paper copy in the mail If you have any lab test that is abnormal or we need to change your treatment, we will call you to review the results.   Testing/Procedures: Your physician has requested that you have an echocardiogram. Echocardiography is a painless test that uses sound waves to create images of your heart. It provides your doctor with information about the size and shape of your heart and how well your heart's chambers and valves are working. This procedure takes approximately one hour. There are no restrictions for this procedure. Please do NOT wear cologne, perfume, aftershave, or lotions (deodorant is allowed). Please arrive 15 minutes prior to your appointment time.  Please note: We ask at that you not bring children with you during ultrasound (echo/ vascular) testing. Due to room size and safety concerns, children are not allowed in the ultrasound rooms during exams. Our front office staff cannot provide observation of children in our lobby area while testing is being conducted. An adult accompanying a patient to their appointment will only be allowed in the ultrasound room at the discretion of the ultrasound technician under special circumstances. We apologize for any inconvenience.    Your cardiac CT will be scheduled at one of the below locations:   [facility] [address] [place], [place] [address] [phone]  OR  [facility] [address] [place], [place] [address] [phone]  OR   [facility] [address] [place], [place] [address] [phone]  OR   [facility] [address] [place], [place] [address] [phone]  If scheduled at [facility], please arrive at the [facility] and Children's Entrance (Entrance C2) of [facility] 30 minutes prior to test start time. You can use the FREE valet parking offered at entrance C (encouraged to control the heart rate for the test)  Proceed to the [facility] Radiology Department (first floor) to check-in and test prep.  All radiology patients and guests should use entrance C2 at [facility], accessed from [facility], even though the hospital's physical address listed is [address].    If scheduled at [facility] or [facility], please arrive 15 mins early for check-in and test prep.  There is spacious parking and easy access to the radiology department from the [facility] Heart and Vascular entrance. Please enter here and check-in with the desk attendant.   If scheduled at [facility], please arrive 30 minutes early for check-in and test prep.  Please follow these instructions carefully (unless otherwise directed):  An IV will be required for this test and Nitroglycerin will be given.  Hold all erectile dysfunction medications at least 3 days (72 hrs) prior to test. (Ie viagra, cialis, sildenafil, tadalafil, etc)   On the Night Before the Test: Be sure to Drink plenty of water. Do not consume any caffeinated/decaffeinated  beverages or chocolate 12 hours prior to your test. Do not take any antihistamines 12 hours prior to your test.  On the Day of the Test: Drink plenty of water until 1 hour prior to the test. Do not eat any food 1 hour prior to test. You may take your regular medications  prior to the test.  Take metoprolol (Lopressor) the morning of your test.      After the Test: Drink plenty of water. After receiving IV contrast, you may experience a mild flushed feeling. This is normal. On occasion, you may experience a mild rash up to 24 hours after the test. This is not dangerous. If this occurs, you can take Benadryl 25 mg, Zyrtec, Claritin, or Allegra and increase your fluid intake. (Patients taking Tikosyn should avoid Benadryl, and may take Zyrtec, Claritin, or Allegra) If you experience trouble breathing, this can be serious. If it is severe call 911 IMMEDIATELY. If it is mild, please call our office.  We will call to schedule your test 2-4 weeks out understanding that some insurance companies will need an authorization prior to the service being performed.   For more information and frequently asked questions, please visit our website : [email or web address]  For non-scheduling related questions, please contact the cardiac imaging nurse navigator should you have any questions/concerns: Cardiac Imaging Nurse Navigators Direct Office Dial: [phone]   For scheduling needs, including cancellations and rescheduling, please call [place], [phone].    Follow-Up: At [facility], you and your health needs are our priority.  As part of our continuing mission to provide you with exceptional heart care, we have created designated Provider Care Teams.  These Care Teams include your primary Cardiologist (physician) and Advanced Practice Providers (APPs -  Physician Assistants and Nurse Practitioners) who all work together to provide you with the care you need, when you need it.  We recommend signing up for the patient portal called "MyChart".  Sign up information is provided on this After Visit Summary.  MyChart is used to connect with patients for Virtual Visits (Telemedicine).  Patients are able to view lab/test results, encounter notes, upcoming  appointments, etc.  Non-urgent messages can be sent to your provider as well.   To learn more about what you can do with MyChart, go to [email or web address].    Your next appointment:   1 month(s)  Provider:   Huntley Dec, MD    Other Instructions None

## 2023-07-29 ENCOUNTER — Other Ambulatory Visit: Payer: Self-pay

## 2023-07-29 DIAGNOSIS — I10 Essential (primary) hypertension: Secondary | ICD-10-CM

## 2023-07-29 DIAGNOSIS — R079 Chest pain, unspecified: Secondary | ICD-10-CM

## 2023-07-29 LAB — BASIC METABOLIC PANEL
BUN/Creatinine Ratio: 14 (ref 9–20)
BUN: 12 mg/dL (ref 6–24)
CO2: 25 mmol/L (ref 20–29)
Calcium: 9.4 mg/dL (ref 8.7–10.2)
Chloride: 101 mmol/L (ref 96–106)
Creatinine, Ser: 0.85 mg/dL (ref 0.76–1.27)
Glucose: 105 mg/dL — ABNORMAL HIGH (ref 70–99)
Potassium: 4.3 mmol/L (ref 3.5–5.2)
Sodium: 140 mmol/L (ref 134–144)
eGFR: 109 mL/min/{1.73_m2} (ref >59)

## 2023-08-05 ENCOUNTER — Telehealth: Payer: Self-pay

## 2023-08-05 ENCOUNTER — Other Ambulatory Visit: Payer: Self-pay

## 2023-08-05 NOTE — Telephone Encounter (Signed)
 STAT if HR is under 50 or over 120 (normal HR is 60-100 beats per minute)  What is your heart rate? 73  Do you have a log of your heart rate readings (document readings)? 58 this morning   Do you have any other symptoms? Patient had a little dizziness on his walk this morning    Patient believes this may be due to new medication, but would like to speak with someone in regards to this.

## 2023-08-05 NOTE — Telephone Encounter (Signed)
 Left message for the patient to call back.

## 2023-08-05 NOTE — Telephone Encounter (Signed)
 Called patient and he reported that when he woke up this morning his heart rate was 58 and his blood pressure was 157/99. This was before he had taken his morning medication. He then went for a walk and as he turned around to start walking home he became a little dizzy but it quickly passed. He finished his walk and took his morning medications and his current blood pressure is 144/90 HR 73. His question is at what point does he need to be worried about his heart rate running to low?

## 2023-08-08 NOTE — Telephone Encounter (Signed)
 Called patient and informed him of Dr. Hulen Shouts recommendation below:  "Concerned with heart rates less than 50 bpm"  Patient verbalized understanding and states that he feels fine now and believes that that one episode was an anomaly. I instructed him to call us if he has any other concerns. Patient had no further questions at this time.

## 2023-08-08 NOTE — Telephone Encounter (Signed)
 Patient returning call.

## 2023-08-08 NOTE — Telephone Encounter (Signed)
 Left message for the patient to call back.

## 2023-08-08 NOTE — Telephone Encounter (Signed)
 Patient returned RN's call.

## 2023-08-16 ENCOUNTER — Ambulatory Visit (HOSPITAL_BASED_OUTPATIENT_CLINIC_OR_DEPARTMENT_OTHER)
Admission: RE | Admit: 2023-08-16 | Discharge: 2023-08-16 | Disposition: A | Discharge status: Home / Self Care | Payer: 59 | Source: Ambulatory Visit

## 2023-08-16 DIAGNOSIS — R079 Chest pain, unspecified: Secondary | ICD-10-CM | POA: Diagnosis present

## 2023-08-16 LAB — ECHOCARDIOGRAM COMPLETE
AR max vel: 2.51 cm2
AV Area VTI: 2.4 cm2
AV Area mean vel: 2.5 cm2
AV Mean grad: 5 mmHg
AV Peak grad: 9.6 mmHg
Ao pk vel: 1.55 m/s
Area-P 1/2: 3.08 cm2
Calc EF: 60 %
MV M vel: 2.97 m/s
MV Peak grad: 35.3 mmHg
S' Lateral: 3.7 cm
Single Plane A2C EF: 58 %
Single Plane A4C EF: 60.7 %

## 2023-08-22 ENCOUNTER — Telehealth: Payer: Self-pay

## 2023-08-22 NOTE — Telephone Encounter (Signed)
*  STAT* If patient is at the pharmacy, call can be transferred to refill team.   1. Which medications need to be refilled? (please list name of each medication and dose if known) amLODipine (NORVASC) 5 MG tablet   2. Which pharmacy/location (including street and city if local pharmacy) is medication to be sent to? [facility] [id number] - [place], [place] - [address]   3. Do they need a 30 day or 90 day supply? 90

## 2023-08-23 MED ORDER — AMLODIPINE BESYLATE 5 MG PO TABS: 5.0000 mg | ORAL_TABLET | Freq: Every day | ORAL | 2 refills | Status: DC

## 2023-08-23 NOTE — Telephone Encounter (Signed)
 Refill of Amlodipine 5 mg sent to [facility] on [address], [address].

## 2023-08-26 ENCOUNTER — Encounter (HOSPITAL_COMMUNITY): Payer: Self-pay

## 2023-08-29 ENCOUNTER — Telehealth (HOSPITAL_COMMUNITY): Payer: Self-pay | Admitting: *Deleted

## 2023-08-29 NOTE — Telephone Encounter (Signed)
 Attempted to call patient regarding upcoming cardiac CT appointment. Left message on voicemail with name and callback number  Larey Brick RN Navigator Cardiac Imaging [facility] [phone] Office [phone] Cell

## 2023-08-29 NOTE — Telephone Encounter (Signed)
 Reaching out to patient to offer assistance regarding upcoming cardiac imaging study; pt verbalizes understanding of appt date/time, parking situation and where to check in, pre-test NPO status and medications ordered, and verified current allergies; name and call back number provided for further questions should they arise Johney Frame RN Navigator Cardiac Imaging [facility] [phone] office [phone] cell

## 2023-08-30 ENCOUNTER — Other Ambulatory Visit: Payer: Self-pay | Admitting: Cardiology

## 2023-08-30 ENCOUNTER — Ambulatory Visit (HOSPITAL_BASED_OUTPATIENT_CLINIC_OR_DEPARTMENT_OTHER)
Admission: RE | Admit: 2023-08-30 | Discharge: 2023-08-30 | Disposition: A | Discharge status: Home / Self Care | Source: Ambulatory Visit | Attending: Cardiology | Admitting: Cardiology

## 2023-08-30 ENCOUNTER — Ambulatory Visit (HOSPITAL_BASED_OUTPATIENT_CLINIC_OR_DEPARTMENT_OTHER)
Admission: RE | Admit: 2023-08-30 | Discharge: 2023-08-30 | Disposition: A | Discharge status: Home / Self Care | Payer: 59 | Source: Ambulatory Visit

## 2023-08-30 ENCOUNTER — Encounter (HOSPITAL_BASED_OUTPATIENT_CLINIC_OR_DEPARTMENT_OTHER): Payer: Self-pay

## 2023-08-30 DIAGNOSIS — R079 Chest pain, unspecified: Secondary | ICD-10-CM | POA: Diagnosis present

## 2023-08-30 DIAGNOSIS — R931 Abnormal findings on diagnostic imaging of heart and coronary circulation: Secondary | ICD-10-CM

## 2023-08-30 MED ORDER — IOHEXOL 350 MG/ML SOLN
95.0000 mL | Freq: Once | INTRAVENOUS | Status: AC | PRN
  Administered 2023-08-30: 95 mL via INTRAVENOUS

## 2023-08-30 MED ORDER — NITROGLYCERIN 0.4 MG SL SUBL
0.8000 mg | SUBLINGUAL_TABLET | Freq: Once | SUBLINGUAL | Status: AC
  Administered 2023-08-30: 0.8 mg via SUBLINGUAL

## 2023-08-30 MED ORDER — METOPROLOL TARTRATE 5 MG/5ML IV SOLN
5.0000 mg | Freq: Once | INTRAVENOUS | Status: AC
  Administered 2023-08-30: 5 mg via INTRAVENOUS

## 2023-08-30 NOTE — Progress Notes (Unsigned)
 FFR Order

## 2023-08-31 ENCOUNTER — Ambulatory Visit: Payer: 59

## 2023-08-31 VITALS — BP 132/90 | HR 82 | Ht 69.0 in | Wt 220.0 lb

## 2023-08-31 DIAGNOSIS — I10 Essential (primary) hypertension: Secondary | ICD-10-CM

## 2023-08-31 DIAGNOSIS — I77810 Thoracic aortic ectasia: Secondary | ICD-10-CM

## 2023-08-31 DIAGNOSIS — I2510 Atherosclerotic heart disease of native coronary artery without angina pectoris: Secondary | ICD-10-CM | POA: Diagnosis not present

## 2023-08-31 MED ORDER — AMLODIPINE BESYLATE 10 MG PO TABS: 10.0000 mg | ORAL_TABLET | Freq: Every day | ORAL | 3 refills | Status: AC

## 2023-08-31 MED ORDER — ROSUVASTATIN CALCIUM 10 MG PO TABS: 10.0000 mg | ORAL_TABLET | Freq: Every day | ORAL | 3 refills | Status: AC

## 2023-08-31 MED ORDER — ASPIRIN 81 MG PO TBEC: 81.0000 mg | DELAYED_RELEASE_TABLET | Freq: Every day | ORAL | Status: AC

## 2023-08-31 NOTE — Progress Notes (Signed)
 Cardiology Consultation:    Date:  08/31/2023   ID:  Cody Hayes, DOB [birth year], MRN [id number]  PCP:  Tally Joe, MD  Cardiologist:  Marlyn Corporal Clorissa Gruenberg, MD   Referring MD: Tally Joe, MD   No chief complaint on file.    ASSESSMENT AND PLAN:   Mr. 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  Problem List Items Addressed This Visit     Hypertension - Primary   Blood pressures improved significantly. Target below 130/80 mmHg. Continue carvedilol 6.25 mg twice daily.  Heart rates ranging around 50s to 60s at home at baseline. Titrate up amlodipine to 10 mg once daily       CAD (coronary artery disease), cardiac CT 08-30-2023, Ca score 140 total plaque volume 309 mm cube, CAD RADS 3 study 50 to 69% diagonal, not significant by CT FFR   Reviewed the findings on the cardiac CT.  In the setting of coronary atherosclerosis recommended statins, Crestor 10 mg once daily. Discussed benefits and risks. Will obtain fasting lipid panel in the next 7 days.  If he is tolerating Crestor well advised him to continue taking it and repeat CMP tentatively in 1 month.  Discussed aspirin 81 mg once daily for primary prevention.  He is agreeable to take.  Discussed potential benefits and side effects.  Good exercise capacity and regularly exercising.  Encouraged to continue doing the same.  Importance of dietary and lifestyle modifications for overall cardiovascular risk reduction discussed.      Mild ascending aorta dilatation (HCC), 4.2 cm by cardiac CT imaging 08-30-2023   Discussed the findings. Will repeat CTA chest to assess aorta size tentatively in 1 year.        Tentative follow-up in the office in 1 year.  History of Present Illness:     Cody Hayes is a 48 y.o. male who is being seen today for follow-up visit. Last visit with me in the office was 07-28-2023. PCP is Tally Joe, MD.  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  Lives with his wife and children at home.  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  Recently had a visit to the hospital in the setting of uncontrolled blood pressure, low-grade fever and tachycardia with flat high-sensitivity troponin I 24, 26 and nonspecific EKG changes.  In addition to amlodipine that was started in the ER carvedilol was added in the last office visit.  Further evaluation with echocardiogram and cardiac CT coronary angiogram was recommended given his symptoms and EKG changes.   Echocardiogram done 08-16-2023 noted normal biventricular function LVEF 60 to 65%, mild LVH.  Ascending aorta measured 3.8 cm, borderline dilated.  No significant valve abnormalities.  Cardiac CT coronary angiogram completed 08-30-2023 reported calcium score of 140, total plaque volume of 309 mm cube, CAD RADS 3 study with 50 to 69% lesion of the proximal diagonal and CT FFR of this lesion showed low likelihood of hemodynamically significant stenosis.  Doing well.  He started exercising walking gradually increased up to 4 to 5 miles in the last couple days. Denies any symptoms. Blood pressure log from home reviewed. Improved systolic at  times readings in 130s and diastolic readings typically in 80s and above. Tolerating his current medications well. Reviewed findings from echocardiogram and cardiac CT imaging at length.  Does not smoke, use alcohol or drug use. No recent lipid panel available.   Past Medical History:  Diagnosis Date   Chest pain of uncertain etiology 07/28/2023   Hypertension     Past Surgical  History:  Procedure Laterality Date   TONSILLECTOMY      Current Medications: Current Meds  Medication Sig   amLODipine (NORVASC) 5 MG tablet Take 1 tablet (5 mg total) by mouth daily.   carvedilol (COREG) 6.25 MG tablet Take 1 tablet (6.25 mg total) by mouth 2 (two) times daily.     Allergies:   Patient has no known allergies.   Social History   Socioeconomic History   Marital status: Divorced    Spouse name: Not on file   Number of children: Not on file   Years of education: Not on file   Highest education level: Not on file  Occupational History   Not on file  Tobacco Use   Smoking status: Never   Smokeless tobacco: Never  Vaping Use   Vaping status: Never Used  Substance and Sexual Activity   Alcohol use: Yes    Alcohol/week: 1.0 standard drink of alcohol    Types: 1 Glasses of wine per week   Drug use: Never   Sexual activity: Not on file  Other Topics Concern   Not on file  Social History Narrative   Not on file   Social Drivers of Health   Financial Resource Strain: Not on file  Food Insecurity: Not on file  Transportation Needs: Not on file  Physical Activity: Not on file  Stress: Not on file  Social Connections: Not on file     Family History: The patient's family history includes Diabetes in his mother; Hypertension in his brother, father, and mother; Lymphoma in his mother; Stroke in his father. ROS:   Please see the history of present illness.    All 14 point review of systems negative except as described per history of present illness.  EKGs/Labs/Other Studies Reviewed:    The following studies were reviewed today:   EKG:       Recent Labs: 07/26/2023: ALT 38; B Natriuretic Peptide 46.7; Hemoglobin 14.0; Platelets 229; TSH 0.840 07/28/2023: BUN 12; Creatinine, Ser 0.85; Potassium 4.3; Sodium 140  Recent Lipid Panel No results found for: "CHOL", "TRIG", "HDL", "CHOLHDL", "VLDL", "LDLCALC", "LDLDIRECT"  Physical Exam:    VS:  BP (!)  132/90   Pulse 82   Ht 5\' 9"  (1.753 m)   Wt 220 lb (99.8 kg)   SpO2 98%   BMI 32.49 kg/m     Wt Readings from Last 3 Encounters:  08/31/23 220 lb (99.8 kg)  07/28/23 231 lb (104.8 kg)  07/26/23 230 lb (104.3 kg)     GENERAL:  Well nourished, well developed in no acute distress NECK: No JVD; No carotid bruits CARDIAC: RRR, S1 and S2 present, no murmurs, no rubs, no gallops CHEST:  Clear to auscultation without rales, wheezing or rhonchi  Extremities: No pitting pedal edema. Pulses bilaterally symmetric with radial 2+ and dorsalis pedis 2+ NEUROLOGIC:  Alert and oriented x 3  Medication Adjustments/Labs and Tests Ordered: Current medicines are reviewed at length with the patient today.  Concerns regarding medicines are outlined above.  No orders of the defined types were placed in this encounter.  No  orders of the defined types were placed in this encounter.   Signed, Cecille Amsterdam, MD, MPH, [facility]. 08/31/2023 8:59 AM    [facility]

## 2023-08-31 NOTE — Assessment & Plan Note (Signed)
 Discussed the findings. Will repeat CTA chest to assess aorta size tentatively in 1 year.

## 2023-08-31 NOTE — Patient Instructions (Addendum)
 Medication Instructions:    Start Aspirin 81 mg once a day .   Start Crestor 10 mg once a day   Increase Amlodipine to 10 mg once a day.    *If you need a refill on your cardiac medications before your next appointment, please call your pharmacy*   Lab Work: Your physician recommends that you have labs done in the office today. Your test included  lipids.   Your physician recommends that you return for lab work in: 1 month after starting Crestor   You can have these done at [address] at  [facility]  You can come Monday through Friday 8:00 am to 11:30 am and 1:00 to 4:00. You do not need to make an appointment as the order has already been placed. The labs you are going to have done are CMP    If you have labs (blood work) drawn today and your tests are completely normal, you will receive your results only by: MyChart Message (if you have MyChart) OR A paper copy in the mail If you have any lab test that is abnormal or we need to change your treatment, we will call you to review the results.   Testing/Procedures: None Ordered   Follow-Up: At [facility], you and your health needs are our priority.  As part of our continuing mission to provide you with exceptional heart care, we have created designated Provider Care Teams.  These Care Teams include your primary Cardiologist (physician) and Advanced Practice Providers (APPs -  Physician Assistants and Nurse Practitioners) who all work together to provide you with the care you need, when you need it.  We recommend signing up for the patient portal called "MyChart".  Sign up information is provided on this After Visit Summary.  MyChart is used to connect with patients for Virtual Visits (Telemedicine).  Patients are able to view lab/test results, encounter notes, upcoming appointments, etc.  Non-urgent messages can be sent to your provider as well.   To learn more about what you can do with MyChart, go to  [email or web address].    Your next appointment:   1 year

## 2023-08-31 NOTE — Assessment & Plan Note (Addendum)
 Reviewed the findings on the cardiac CT.  In the setting of coronary atherosclerosis recommended statins, Crestor 10 mg once daily. Discussed benefits and risks. Will obtain fasting lipid panel in the next 7 days.  If he is tolerating Crestor well advised him to continue taking it and repeat CMP tentatively in 1 month.  Discussed aspirin 81 mg once daily for primary prevention.  He is agreeable to take.  Discussed potential benefits and side effects.  Good exercise capacity and regularly exercising.  Encouraged to continue doing the same.  Importance of dietary and lifestyle modifications for overall cardiovascular risk reduction discussed.

## 2023-08-31 NOTE — Assessment & Plan Note (Signed)
 Blood pressures improved significantly. Target below 130/80 mmHg. Continue carvedilol 6.25 mg twice daily.  Heart rates ranging around 50s to 60s at home at baseline. Titrate up amlodipine to 10 mg once daily

## 2023-09-01 ENCOUNTER — Telehealth: Payer: Self-pay

## 2023-09-01 NOTE — Telephone Encounter (Signed)
 Patient notified through my chart.

## 2023-09-01 NOTE — Telephone Encounter (Signed)
-----   Message from Gypsy Balsam sent at 09/01/2023  1:36 PM EDT ----- FFR showed no significant stenosis

## 2023-09-03 LAB — LIPID PANEL
Chol/HDL Ratio: 2.6 ratio (ref 0.0–5.0)
Cholesterol, Total: 134 mg/dL (ref 100–199)
HDL: 52 mg/dL (ref >39)
LDL Chol Calc (NIH): 71 mg/dL (ref 0–99)
Triglycerides: 49 mg/dL (ref 0–149)
VLDL Cholesterol Cal: 11 mg/dL (ref 5–40)

## 2023-09-30 ENCOUNTER — Other Ambulatory Visit: Payer: Self-pay

## 2023-09-30 MED ORDER — CARVEDILOL 3.125 MG PO TABS: 3.1250 mg | ORAL_TABLET | Freq: Two times a day (BID) | ORAL | 3 refills | Status: AC

## 2024-01-08 ENCOUNTER — Ambulatory Visit: Payer: Self-pay

## 2024-04-19 ENCOUNTER — Encounter (HOSPITAL_BASED_OUTPATIENT_CLINIC_OR_DEPARTMENT_OTHER): Payer: Self-pay | Admitting: Emergency Medicine

## 2024-04-19 ENCOUNTER — Telehealth: Payer: Self-pay

## 2024-04-19 ENCOUNTER — Other Ambulatory Visit: Payer: Self-pay

## 2024-04-19 DIAGNOSIS — T461X1A Poisoning by calcium-channel blockers, accidental (unintentional), initial encounter: Secondary | ICD-10-CM | POA: Diagnosis present

## 2024-04-19 DIAGNOSIS — Z7982 Long term (current) use of aspirin: Secondary | ICD-10-CM | POA: Diagnosis not present

## 2024-04-19 NOTE — Telephone Encounter (Signed)
 Patient took an extra tablet of amlodipine  10 mg tonight (20 mg total) and was concerned about the effects. I called him back, but he was already at [facility] ER. He feels fine, but will stay in the ER to be examined. I will let our ER colleagues follow up. Cardiology is available for assistance if needed.

## 2024-04-19 NOTE — ED Triage Notes (Signed)
 Pt takes 10 mg of amlodipine  in the morning. Tonight reached for his other medication but accidentally took another amlodipine .  Total of 20 mg of amlodipine  today. Pt is worried about the effects.

## 2024-04-20 ENCOUNTER — Emergency Department (HOSPITAL_BASED_OUTPATIENT_CLINIC_OR_DEPARTMENT_OTHER)
Admission: EM | Admit: 2024-04-20 | Discharge: 2024-04-20 | Disposition: A | Discharge status: Home / Self Care | Attending: Emergency Medicine | Admitting: Emergency Medicine

## 2024-04-20 DIAGNOSIS — T6591XA Toxic effect of unspecified substance, accidental (unintentional), initial encounter: Secondary | ICD-10-CM

## 2024-04-20 NOTE — ED Provider Notes (Signed)
 [place] [facility] AT [facility] Provider Note   CSN: [id number] Arrival date & time: 04/19/24  2042     Patient presents with: Ingestion   Cody Hayes is a 48 y.o. male.   The history is provided by the patient.  Ingestion  Cody Hayes is a 48 y.o. male who presents to the Emergency Department complaining of accidental overdose.  He presents to the emergency department after he accidentally took a second dose of amlodipine  at 8 PM.  He typically takes 10 mg of amlodipine  at 7 AM.  This evening he thought he was taking his Crestor  when he took a second dose of the amlodipine .  He he also takes carvedilol  twice daily.  Reports feeling tired currently, no additional systemic symptoms.  In particular no chest pain, shortness of breath, nausea, vomiting.  Past medical history is significant for hypertension.      Prior to Admission medications   Medication Sig Start Date End Date Taking? Authorizing Provider  amLODipine  (NORVASC ) 10 MG tablet Take 1 tablet (10 mg total) by mouth daily. 08/31/23 11/29/23  Madireddy, Alean SAUNDERS, MD  aspirin  EC 81 MG tablet Take 1 tablet (81 mg total) by mouth daily. Swallow whole. 08/31/23   Madireddy, Alean SAUNDERS, MD  carvedilol  (COREG ) 3.125 MG tablet Take 1 tablet (3.125 mg total) by mouth 2 (two) times daily. 09/30/23   Madireddy, Alean SAUNDERS, MD  rosuvastatin  (CRESTOR ) 10 MG tablet Take 1 tablet (10 mg total) by mouth daily. 08/31/23 11/29/23  Madireddy, Alean SAUNDERS, MD    Allergies: Patient has no known allergies.    Review of Systems  All other systems reviewed and are negative.   Updated Vital Signs BP 129/83   Pulse 66   Temp 98.2 F (36.8 C) (Oral)   Resp 18   SpO2 98%   Physical Exam Vitals and nursing note reviewed.  Constitutional:      Appearance: He is well-developed.  HENT:     Head: Normocephalic and atraumatic.  Cardiovascular:     Rate and Rhythm: Normal rate and regular rhythm.     Heart sounds: No  murmur heard. Pulmonary:     Effort: Pulmonary effort is normal. No respiratory distress.     Breath sounds: Normal breath sounds.  Abdominal:     Palpations: Abdomen is soft.     Tenderness: There is no abdominal tenderness. There is no guarding or rebound.  Musculoskeletal:        General: No tenderness.  Skin:    General: Skin is warm and dry.  Neurological:     Mental Status: He is alert and oriented to person, place, and time.  Psychiatric:        Behavior: Behavior normal.     (all labs ordered are listed, but only abnormal results are displayed) Labs Reviewed - No data to display  EKG: EKG Interpretation Date/Time:  Friday April 20 2024 01:48:43 EST Ventricular Rate:  58 PR Interval:  142 QRS Duration:  111 QT Interval:  440 QTC Calculation: 433 R Axis:   53  Text Interpretation: Sinus rhythm Borderline T wave abnormalities Confirmed by Griselda Norris [phone]) on 04/20/2024 1:52:30 AM  Radiology: No results found.   Procedures   Medications Ordered in the ED - No data to display                                  Medical  Decision Making  Patient with history of hypertension here for evaluation after accidental ingestion of an additional dose of his amlodipine .  Nursing discussed the case with poison control with recommendation for EKG and total of 10-hour observation following ingestion. EKG without evidence of heart block.  Patient remained asymptomatic for several hours in the emergency department.  Feel he is stable for discharge home with outpatient follow-up.  Discussed holding his morning amlodipine  and he may resume it on Saturday.     Final diagnoses:  Accidental ingestion of substance, initial encounter    ED Discharge Orders     None          Griselda Norris, MD 04/20/24 [phone]

## 2024-04-20 NOTE — Discharge Instructions (Addendum)
 Do not take your amlodipine  this morning.  You can continue your carvedilol .   You can restart your amlodipine  on Saturday morning.

## 2024-04-20 NOTE — ED Notes (Signed)
 Pt accidentally took a total of 20mg  of Norvasc  today, concerned about any side effects and wanted to be evalated

## 2024-04-20 NOTE — ED Notes (Addendum)
Pt resting, VSS.

## 2024-05-17 ENCOUNTER — Other Ambulatory Visit: Payer: Self-pay
